# Patient Record
Sex: Female | Born: 1985
Health system: Southern US, Community
[De-identification: ages and names within clinical notes are randomized; demographics above are authoritative.]

---

## 2015-11-21 IMAGING — CR DG LUMBAR SPINE COMPLETE 4+V
5 series · 5 of 5 positions shown · non-contrast
Comparison: CT abdomen and pelvis 07/27/2011

CLINICAL DATA: Lower back pain after a fall.

EXAM:
LUMBAR SPINE - COMPLETE 4+ VIEW

[t lumbar spine ap]
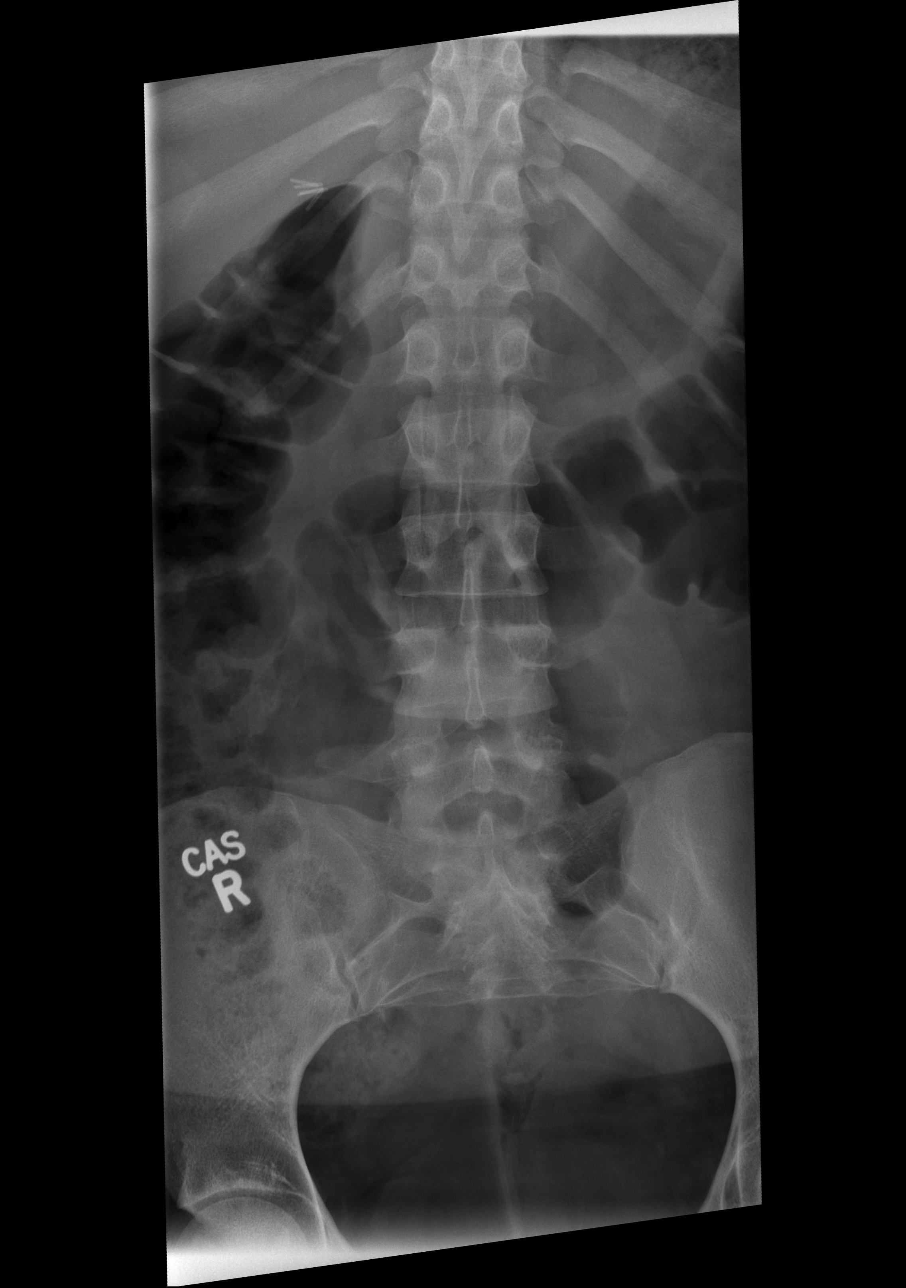

[t lumbar spine obl (1 of 2)]
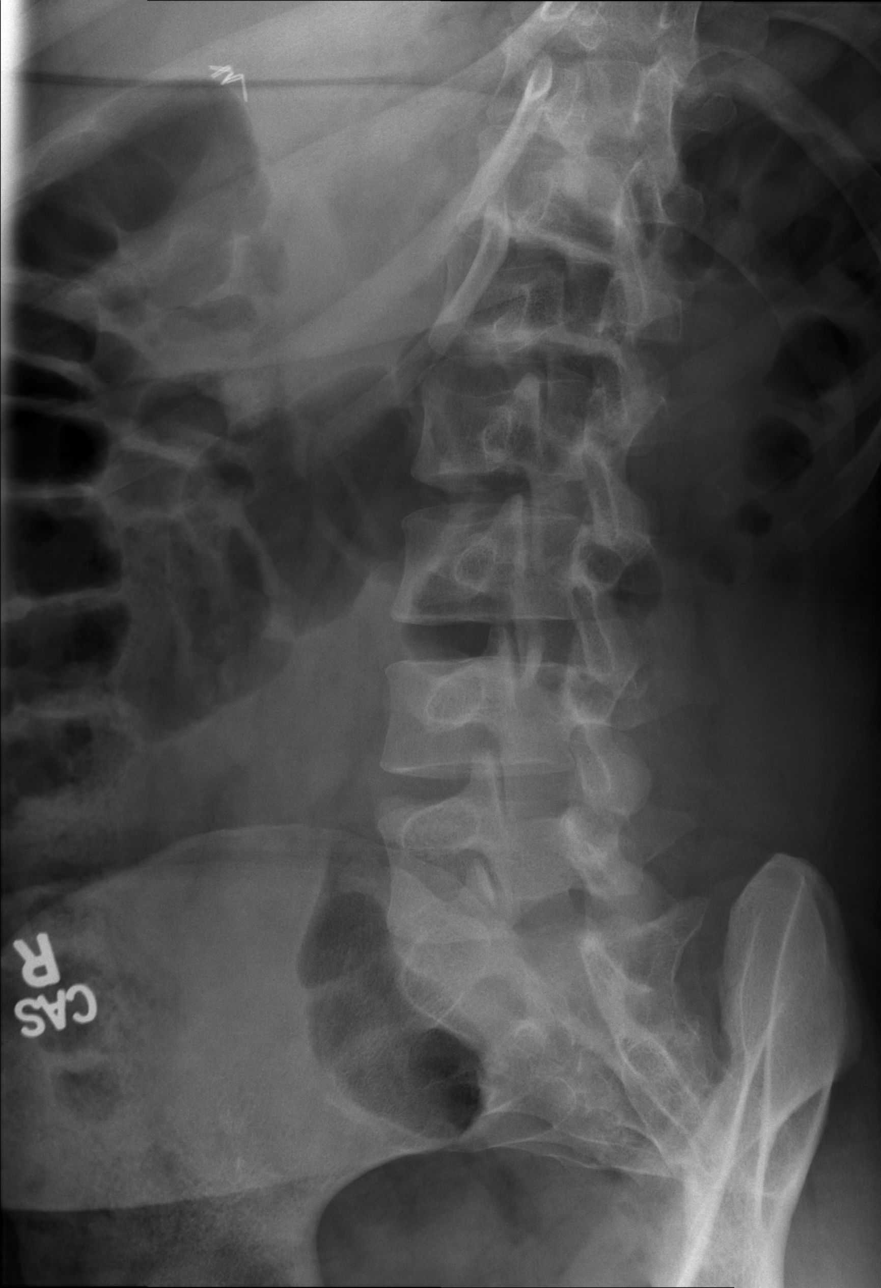

[t lumbar spine obl (2 of 2)]
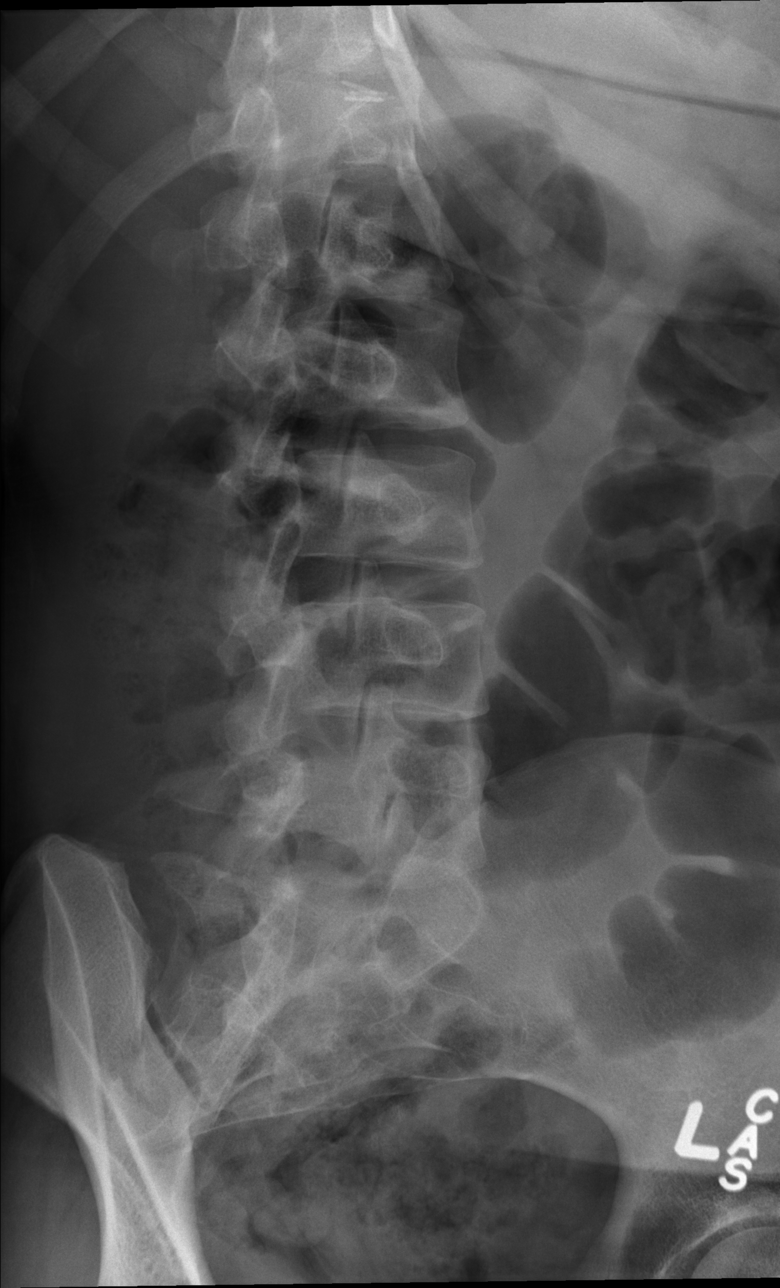

[t lumbar spine lat]
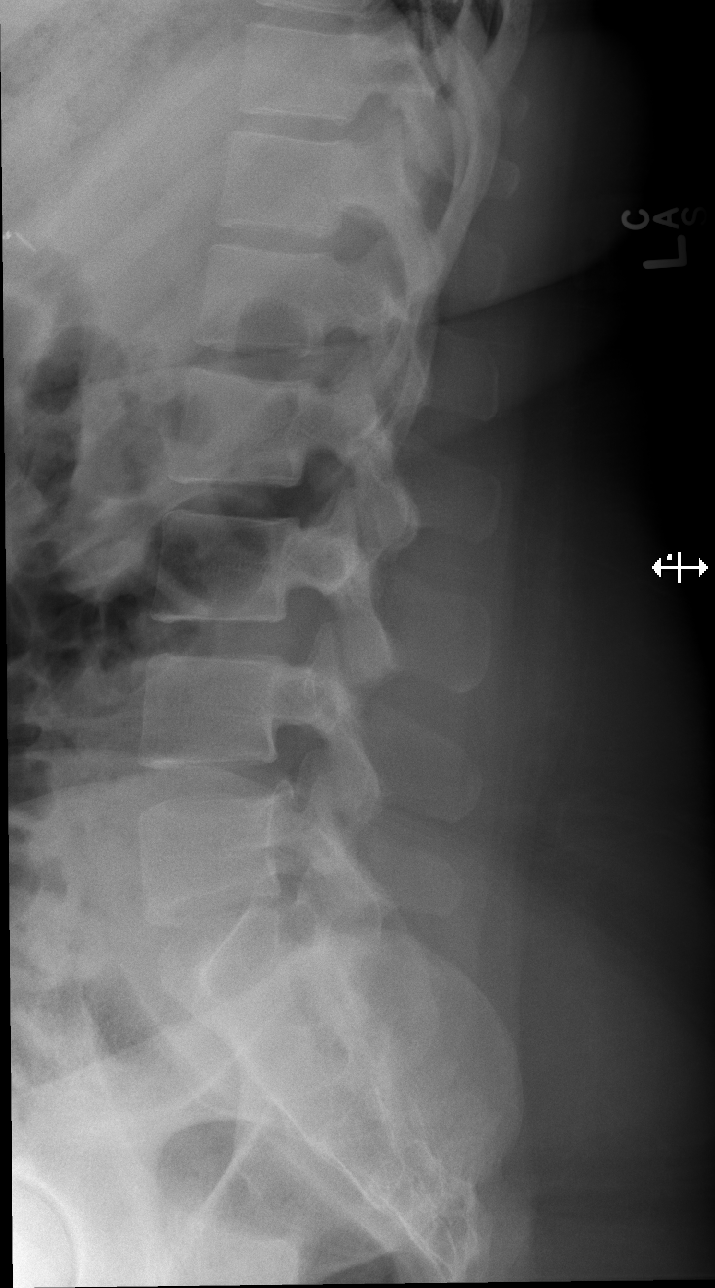

[t lumbar l-5 s-1 spot]
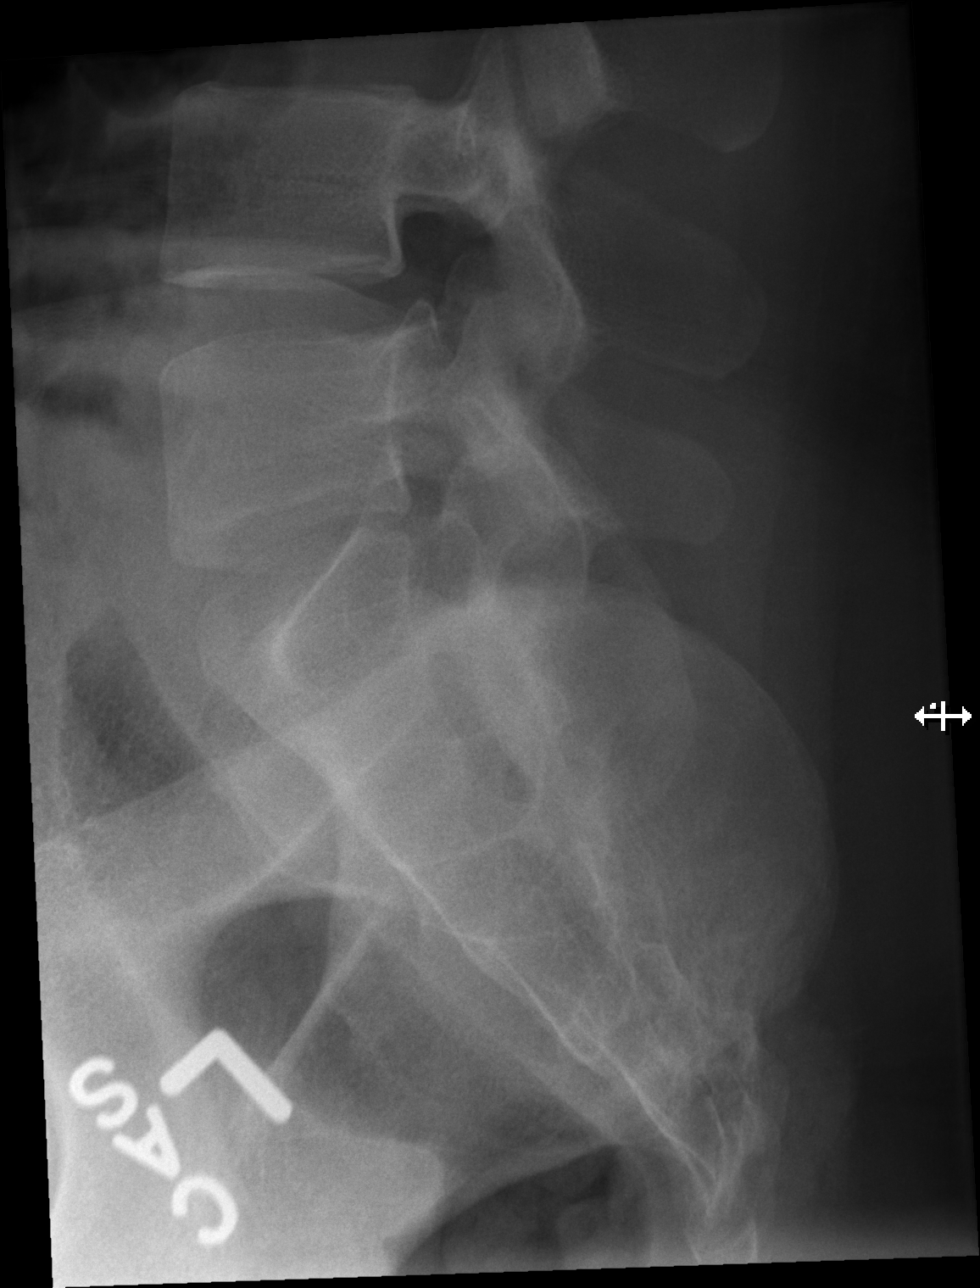

[5 of 5 positions shown; findings below may reference images not displayed]

FINDINGS: There is no evidence of lumbar spine fracture. Alignment is normal.
Intervertebral disc spaces are maintained.
IMPRESSION: Negative.

## 2016-02-21 MED FILL — TOPIRAMATE 25 MG TABLET: 25 | 30 days supply | Qty: 90 | Fill #0

## 2016-02-21 MED FILL — traMADol HCL 50 MG TABS: 50 | 5 days supply | Qty: 20 | Fill #0

## 2017-04-12 NOTE — Nursing Note (Signed)
Medication Administration Follow Up-Text       Medication Administration Follow Up Entered On:  04/12/2017 16:54 EST    Performed On:  04/12/2017 16:53 EST by Belstock, RN, Clelia SchaumannMirtha A      Intervention Information:     ketorolac  Performed by Belstock, RN, Mirtha A on 04/12/2017 15:41:00 EST       ketorolac,30mg   IV Push,Antecubital, Right       Med Response   ED Medication Response :   No adverse reaction, Symptoms improved   Numeric Rating Pain Scale :   0 = No pain   Pasero Opioid Induced Sedation Scale :   S = Sleep, easy to arouse   Belstock, RN, Clelia SchaumannMirtha A - 04/12/2017 16:53 EST
